# Patient Record
Sex: Male | Born: 1965 | Race: White | Hispanic: No | Smoking: Current every day smoker
Health system: Southern US, Community
[De-identification: ages and names within clinical notes are randomized; demographics above are authoritative.]

## PROBLEM LIST (undated history)

## (undated) DIAGNOSIS — R569 Unspecified convulsions: Secondary | ICD-10-CM

## (undated) HISTORY — PX: KNEE SURGERY: SHX244

## (undated) HISTORY — PX: HERNIA REPAIR: SHX51

---

## 2009-11-08 ENCOUNTER — Emergency Department (HOSPITAL_COMMUNITY): Admission: EM | Admit: 2009-11-08 | Discharge: 2009-11-08 | Payer: Self-pay | Admitting: Emergency Medicine

## 2016-02-29 ENCOUNTER — Emergency Department (HOSPITAL_COMMUNITY): Payer: Federal, State, Local not specified - PPO

## 2016-02-29 ENCOUNTER — Encounter (HOSPITAL_COMMUNITY): Payer: Self-pay | Admitting: *Deleted

## 2016-02-29 ENCOUNTER — Emergency Department (HOSPITAL_COMMUNITY)
Admission: EM | Admit: 2016-02-29 | Discharge: 2016-03-01 | Disposition: A | Discharge status: Home / Self Care | Payer: Federal, State, Local not specified - PPO | Attending: Emergency Medicine | Admitting: Emergency Medicine

## 2016-02-29 DIAGNOSIS — R569 Unspecified convulsions: Secondary | ICD-10-CM | POA: Diagnosis present

## 2016-02-29 DIAGNOSIS — R9082 White matter disease, unspecified: Secondary | ICD-10-CM | POA: Diagnosis not present

## 2016-02-29 DIAGNOSIS — F17200 Nicotine dependence, unspecified, uncomplicated: Secondary | ICD-10-CM | POA: Diagnosis not present

## 2016-02-29 LAB — CBC WITH DIFFERENTIAL/PLATELET
BASOS PCT: 1 %
Basophils Absolute: 0.1 10*3/uL (ref 0.0–0.1)
EOS ABS: 0.1 10*3/uL (ref 0.0–0.7)
Eosinophils Relative: 1 %
HCT: 40 % (ref 39.0–52.0)
Hemoglobin: 13.5 g/dL (ref 13.0–17.0)
LYMPHS ABS: 0.6 10*3/uL — AB (ref 0.7–4.0)
Lymphocytes Relative: 8 %
MCH: 32.3 pg (ref 26.0–34.0)
MCHC: 33.8 g/dL (ref 30.0–36.0)
MCV: 95.7 fL (ref 78.0–100.0)
MONO ABS: 0.7 10*3/uL (ref 0.1–1.0)
MONOS PCT: 9 %
Neutro Abs: 6.3 10*3/uL (ref 1.7–7.7)
Neutrophils Relative %: 81 %
Platelets: 123 10*3/uL — ABNORMAL LOW (ref 150–400)
RBC: 4.18 MIL/uL — ABNORMAL LOW (ref 4.22–5.81)
RDW: 14 % (ref 11.5–15.5)
WBC: 7.7 10*3/uL (ref 4.0–10.5)

## 2016-02-29 LAB — COMPREHENSIVE METABOLIC PANEL
ALBUMIN: 3.8 g/dL (ref 3.5–5.0)
ALK PHOS: 83 U/L (ref 38–126)
ALT: 87 U/L — ABNORMAL HIGH (ref 17–63)
ANION GAP: 10 (ref 5–15)
AST: 142 U/L — ABNORMAL HIGH (ref 15–41)
BUN: 13 mg/dL (ref 6–20)
CHLORIDE: 101 mmol/L (ref 101–111)
CO2: 24 mmol/L (ref 22–32)
Calcium: 9 mg/dL (ref 8.9–10.3)
Creatinine, Ser: 0.93 mg/dL (ref 0.61–1.24)
GFR calc Af Amer: >60 mL/min (ref >60)
GFR calc non Af Amer: >60 mL/min (ref >60)
GLUCOSE: 135 mg/dL — AB (ref 65–99)
POTASSIUM: 4.1 mmol/L (ref 3.5–5.1)
SODIUM: 135 mmol/L (ref 135–145)
Total Bilirubin: 0.9 mg/dL (ref 0.3–1.2)
Total Protein: 6.7 g/dL (ref 6.5–8.1)

## 2016-02-29 LAB — ETHANOL

## 2016-02-29 LAB — RAPID URINE DRUG SCREEN, HOSP PERFORMED
AMPHETAMINES: NOT DETECTED
BARBITURATES: NOT DETECTED
BENZODIAZEPINES: NOT DETECTED
Cocaine: NOT DETECTED
Opiates: NOT DETECTED
TETRAHYDROCANNABINOL: POSITIVE — AB

## 2016-02-29 LAB — I-STAT TROPONIN, ED: TROPONIN I, POC: 0 ng/mL (ref 0.00–0.08)

## 2016-02-29 LAB — CBG MONITORING, ED: Glucose-Capillary: 119 mg/dL — ABNORMAL HIGH (ref 65–99)
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
## 2016-02-29 NOTE — Discharge Instructions (Signed)
Your exam was reassuring today. Her symptoms are possibly due to a seizure. It is important for you to follow-up with neurology for definitive care. It is important that you not drive, operate machinery, bathe or cook alone until your evaluated by neurology. Return to ED for new or worsening symptoms as we discussed.  Seizure, Adult A seizure is abnormal electrical activity in the brain. Seizures usually last from 30 seconds to 2 minutes. There are various types of seizures. Before a seizure, you may have a warning sensation (aura) that a seizure is about to occur. An aura may include the following symptoms:   Fear or anxiety.  Nausea.  Feeling like the room is spinning (vertigo).  Vision changes, such as seeing flashing lights or spots. Common symptoms during a seizure include:  A change in attention or behavior (altered mental status).  Convulsions with rhythmic jerking movements.  Drooling.  Rapid eye movements.  Grunting.  Loss of bladder and bowel control.  Bitter taste in the mouth.  Tongue biting. After a seizure, you may feel confused and sleepy. You may also have an injury resulting from convulsions during the seizure. HOME CARE INSTRUCTIONS   If you are given medicines, take them exactly as prescribed by your health care provider.  Keep all follow-up appointments as directed by your health care provider.  Do not swim or drive or engage in risky activity during which a seizure could cause further injury to you or others until your health care provider says it is OK.  Get adequate rest.  Teach friends and family what to do if you have a seizure. They should:  Lay you on the ground to prevent a fall.  Put a cushion under your head.  Loosen any tight clothing around your neck.  Turn you on your side. If vomiting occurs, this helps keep your airway clear.  Stay with you until you recover.  Know whether or not you need emergency care. SEEK IMMEDIATE MEDICAL  CARE IF:  The seizure lasts longer than 5 minutes.  The seizure is severe or you do not wake up immediately after the seizure.  You have an altered mental status after the seizure.  You are having more frequent or worsening seizures. Someone should drive you to the emergency department or call local emergency services (911 in [place] MAKE SURE YOU:  Understand these instructions.  Will watch your condition.  Will get help right away if you are not doing well or get worse.   This information is not intended to replace advice given to you by your health care provider. Make sure you discuss any questions you have with your health care provider.   Document Released: 07/30/2000 Document Revised: 08/23/2014 Document Reviewed: 03/14/2013 Elsevier Interactive Patient Education [employer]2016 Elsevier Inc.
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
## 2016-02-29 NOTE — ED Notes (Signed)
Patient transported to CT 

## 2016-03-01 NOTE — ED Notes (Signed)
Pt departed in NAD.  

## 2016-03-02 ENCOUNTER — Ambulatory Visit (INDEPENDENT_AMBULATORY_CARE_PROVIDER_SITE_OTHER): Payer: Federal, State, Local not specified - PPO | Admitting: Neurology

## 2016-03-02 ENCOUNTER — Encounter: Payer: Self-pay | Admitting: Neurology

## 2016-03-02 VITALS — BP 142/92 | HR 78 | Temp 98.3°F | Ht 72.5 in | Wt 155.0 lb

## 2016-03-02 DIAGNOSIS — R569 Unspecified convulsions: Secondary | ICD-10-CM

## 2016-03-02 DIAGNOSIS — F1020 Alcohol dependence, uncomplicated: Secondary | ICD-10-CM | POA: Diagnosis not present
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
## 2016-03-11 ENCOUNTER — Ambulatory Visit (INDEPENDENT_AMBULATORY_CARE_PROVIDER_SITE_OTHER): Payer: Federal, State, Local not specified - PPO | Admitting: Neurology

## 2016-03-11 DIAGNOSIS — Z029 Encounter for administrative examinations, unspecified: Secondary | ICD-10-CM

## 2016-03-11 DIAGNOSIS — R569 Unspecified convulsions: Secondary | ICD-10-CM

## 2016-03-11 NOTE — Procedures (Signed)
ELECTROENCEPHALOGRAM REPORT  Date of Study: 03/11/2016  Patient's Name: Jack Mccarthy MRN: [id number] Date of Birth: 1965-12-21  Referring Provider: Dr. Patrcia Dolly  Clinical History: This is a 50 year old man with new onset seizure.  Medications: none  Technical Summary: A multichannel digital 1-hour sleep-deprived EEG recording measured by the international 10-20 system with electrodes applied with paste and impedances below 5000 ohms performed in our laboratory with EKG monitoring in an awake and asleep patient.  Hyperventilation and photic stimulation were performed.  The digital EEG was referentially recorded, reformatted, and digitally filtered in a variety of bipolar and referential montages for optimal display.    Description: The patient is awake and asleep during the recording.  During maximal wakefulness, there is a symmetric, medium voltage 11 Hz posterior dominant rhythm that attenuates with eye opening.  The record is symmetric.  During drowsiness and sleep, there is an increase in theta slowing of the background.  Vertex waves and symmetric sleep spindles were seen.  Hyperventilation and photic stimulation did not elicit any abnormalities.  There were no epileptiform discharges or electrographic seizures seen.    EKG lead was unremarkable.  Impression: This 1-hour awake and asleep EEG is normal.    Clinical Correlation: A normal EEG does not exclude a clinical diagnosis of epilepsy.  If further clinical questions remain, prolonged EEG may be helpful.  Clinical correlation is advised.   Patrcia Dolly, M.D.

## 2016-03-11 NOTE — Patient Instructions (Signed)
We have scheduled you at [facility] for your MRI on 03/24/2016 at 3:00 pm. Please arrive 15 minutes prior and go to radiology. If you need to reschedule for any reason please call [phone].

## 2016-03-12 ENCOUNTER — Telehealth: Payer: Self-pay | Admitting: Neurology

## 2016-03-12 NOTE — Telephone Encounter (Signed)
Tried to call patient- no answer at home number and cell phone voicemail full.

## 2016-03-12 NOTE — Telephone Encounter (Signed)
Tried to call patient again with same outcome. If he calls back will make him aware of results.

## 2016-03-12 NOTE — Telephone Encounter (Signed)
-----   Message from Van Clines, MD sent at 03/12/2016  8:33 AM EDT ----- Pls let him know EEG is normal, proceed with MRI as planned. Thanks

## 2016-03-24 ENCOUNTER — Ambulatory Visit (HOSPITAL_COMMUNITY)
Admission: RE | Admit: 2016-03-24 | Discharge: 2016-03-24 | Disposition: A | Discharge status: Home / Self Care | Payer: Federal, State, Local not specified - PPO | Source: Ambulatory Visit | Attending: Neurology | Admitting: Neurology

## 2016-03-24 DIAGNOSIS — R9082 White matter disease, unspecified: Secondary | ICD-10-CM | POA: Insufficient documentation

## 2016-03-24 DIAGNOSIS — R569 Unspecified convulsions: Secondary | ICD-10-CM | POA: Insufficient documentation

## 2016-03-24 MED ORDER — GADOBENATE DIMEGLUMINE 529 MG/ML IV SOLN
15.0000 mL | Freq: Once | INTRAVENOUS | Status: AC | PRN
  Administered 2016-03-24: 14 mL via INTRAVENOUS

## 2016-03-26 NOTE — Progress Notes (Signed)
Called patient on mobile mailbox full. Called home and no answer, no voicemail.

## 2016-04-30 ENCOUNTER — Encounter: Payer: Self-pay | Admitting: Neurology

## 2016-04-30 ENCOUNTER — Ambulatory Visit (INDEPENDENT_AMBULATORY_CARE_PROVIDER_SITE_OTHER): Payer: Federal, State, Local not specified - PPO | Admitting: Neurology

## 2016-04-30 VITALS — BP 122/70 | HR 82 | Temp 98.2°F | Ht 72.5 in | Wt 157.2 lb

## 2016-04-30 DIAGNOSIS — Z029 Encounter for administrative examinations, unspecified: Secondary | ICD-10-CM

## 2016-04-30 DIAGNOSIS — F1020 Alcohol dependence, uncomplicated: Secondary | ICD-10-CM

## 2016-04-30 DIAGNOSIS — R569 Unspecified convulsions: Secondary | ICD-10-CM
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
## 2016-09-08 ENCOUNTER — Ambulatory Visit: Payer: Federal, State, Local not specified - PPO | Admitting: Neurology

## 2017-01-25 ENCOUNTER — Telehealth: Payer: Self-pay | Admitting: Neurology

## 2017-01-25 NOTE — Telephone Encounter (Signed)
Returned pt call.  Let him know that I haven't received anything from him.  Gave him the nurse fax number.  Pt states he is faxing forms now.

## 2017-01-25 NOTE — Telephone Encounter (Signed)
Caller: Cleto   Urgent? No   Reason for the call: Checking if a paper that he faxed yesterday was received? [facility]. Please call if received. Thanks

## 2017-02-01 ENCOUNTER — Encounter: Payer: Self-pay | Admitting: Neurology

## 2017-02-01 ENCOUNTER — Telehealth: Payer: Self-pay | Admitting: Neurology

## 2017-02-01 NOTE — Telephone Encounter (Signed)
Spoke to patient, he has been doing well, no further similar episodes since July 2017. Forms filled out, pls fax [facility], thanks

## 2017-02-01 NOTE — Telephone Encounter (Signed)
Patient is returning your call. He states that he will be at work until 4 and you can call him there at [phone]  He no longer has a home number he stated

## 2018-07-03 ENCOUNTER — Telehealth: Payer: Self-pay

## 2018-07-03 NOTE — Telephone Encounter (Signed)
LMOM letting pt know that I have filled out DMV paperwork and [facility] [place].  I also advised that I have made a copy of paperwork for pt and placed at front desk for him to pick up at his convenience.

## 2020-07-13 ENCOUNTER — Emergency Department (HOSPITAL_COMMUNITY): Payer: Federal, State, Local not specified - PPO

## 2020-07-13 ENCOUNTER — Emergency Department (HOSPITAL_COMMUNITY)
Admission: EM | Admit: 2020-07-13 | Discharge: 2020-07-14 | Disposition: A | Discharge status: Home / Self Care | Payer: Federal, State, Local not specified - PPO | Attending: Emergency Medicine | Admitting: Emergency Medicine

## 2020-07-13 ENCOUNTER — Encounter (HOSPITAL_COMMUNITY): Payer: Self-pay | Admitting: Emergency Medicine

## 2020-07-13 ENCOUNTER — Other Ambulatory Visit: Payer: Self-pay

## 2020-07-13 DIAGNOSIS — F17210 Nicotine dependence, cigarettes, uncomplicated: Secondary | ICD-10-CM | POA: Insufficient documentation

## 2020-07-13 DIAGNOSIS — Y990 Civilian activity done for income or pay: Secondary | ICD-10-CM | POA: Diagnosis not present

## 2020-07-13 DIAGNOSIS — R42 Dizziness and giddiness: Secondary | ICD-10-CM | POA: Insufficient documentation

## 2020-07-13 DIAGNOSIS — R569 Unspecified convulsions: Secondary | ICD-10-CM

## 2020-07-13 DIAGNOSIS — G40509 Epileptic seizures related to external causes, not intractable, without status epilepticus: Secondary | ICD-10-CM | POA: Diagnosis present

## 2020-07-13 DIAGNOSIS — Y33XXXA Other specified events, undetermined intent, initial encounter: Secondary | ICD-10-CM | POA: Diagnosis not present

## 2020-07-13 DIAGNOSIS — F10239 Alcohol dependence with withdrawal, unspecified: Secondary | ICD-10-CM | POA: Diagnosis not present

## 2020-07-13 DIAGNOSIS — F10939 Alcohol use, unspecified with withdrawal, unspecified: Secondary | ICD-10-CM

## 2020-07-13 HISTORY — DX: Unspecified convulsions: R56.9

## 2020-07-13 LAB — CBC WITH DIFFERENTIAL/PLATELET
Abs Immature Granulocytes: 0.05 10*3/uL (ref 0.00–0.07)
Basophils Absolute: 0.1 10*3/uL (ref 0.0–0.1)
Basophils Relative: 1 %
Eosinophils Absolute: 0 10*3/uL (ref 0.0–0.5)
Eosinophils Relative: 0 %
HCT: 35.4 % — ABNORMAL LOW (ref 39.0–52.0)
Hemoglobin: 12.4 g/dL — ABNORMAL LOW (ref 13.0–17.0)
Immature Granulocytes: 1 %
Lymphocytes Relative: 10 %
Lymphs Abs: 0.8 10*3/uL (ref 0.7–4.0)
MCH: 34.6 pg — ABNORMAL HIGH (ref 26.0–34.0)
MCHC: 35 g/dL (ref 30.0–36.0)
MCV: 98.9 fL (ref 80.0–100.0)
Monocytes Absolute: 1.3 10*3/uL — ABNORMAL HIGH (ref 0.1–1.0)
Monocytes Relative: 16 %
Neutro Abs: 5.8 10*3/uL (ref 1.7–7.7)
Neutrophils Relative %: 72 %
Platelets: 144 10*3/uL — ABNORMAL LOW (ref 150–400)
RBC: 3.58 MIL/uL — ABNORMAL LOW (ref 4.22–5.81)
RDW: 14 % (ref 11.5–15.5)
WBC: 8 10*3/uL (ref 4.0–10.5)
nRBC: 0 % (ref 0.0–0.2)

## 2020-07-13 LAB — URINALYSIS, ROUTINE W REFLEX MICROSCOPIC
Bacteria, UA: NONE SEEN
Bilirubin Urine: NEGATIVE
Glucose, UA: NEGATIVE mg/dL
Hgb urine dipstick: NEGATIVE
Ketones, ur: 5 mg/dL — AB
Leukocytes,Ua: NEGATIVE
Nitrite: NEGATIVE
Protein, ur: 100 mg/dL — AB
Specific Gravity, Urine: 1.017 (ref 1.005–1.030)
pH: 8 (ref 5.0–8.0)

## 2020-07-13 LAB — TROPONIN I (HIGH SENSITIVITY)
Troponin I (High Sensitivity): 4 ng/L (ref <18)
Troponin I (High Sensitivity): 5 ng/L (ref <18)

## 2020-07-13 LAB — PROTIME-INR
INR: 0.9 (ref 0.8–1.2)
Prothrombin Time: 12 seconds (ref 11.4–15.2)

## 2020-07-13 LAB — COMPREHENSIVE METABOLIC PANEL
ALT: 47 U/L — ABNORMAL HIGH (ref 0–44)
AST: 66 U/L — ABNORMAL HIGH (ref 15–41)
Albumin: 3.8 g/dL (ref 3.5–5.0)
Alkaline Phosphatase: 73 U/L (ref 38–126)
Anion gap: 13 (ref 5–15)
BUN: 8 mg/dL (ref 6–20)
CO2: 23 mmol/L (ref 22–32)
Calcium: 8.4 mg/dL — ABNORMAL LOW (ref 8.9–10.3)
Chloride: 99 mmol/L (ref 98–111)
Creatinine, Ser: 0.66 mg/dL (ref 0.61–1.24)
GFR, Estimated: >60 mL/min (ref >60)
Glucose, Bld: 94 mg/dL (ref 70–99)
Potassium: 3.2 mmol/L — ABNORMAL LOW (ref 3.5–5.1)
Sodium: 135 mmol/L (ref 135–145)
Total Bilirubin: 1 mg/dL (ref 0.3–1.2)
Total Protein: 6.9 g/dL (ref 6.5–8.1)

## 2020-07-13 LAB — RAPID URINE DRUG SCREEN, HOSP PERFORMED
Amphetamines: NOT DETECTED
Barbiturates: NOT DETECTED
Benzodiazepines: NOT DETECTED
Cocaine: NOT DETECTED
Opiates: NOT DETECTED
Tetrahydrocannabinol: POSITIVE — AB

## 2020-07-13 LAB — MAGNESIUM: Magnesium: 1.5 mg/dL — ABNORMAL LOW (ref 1.7–2.4)

## 2020-07-13 LAB — ETHANOL: Alcohol, Ethyl (B): <10 mg/dL (ref <10)

## 2020-07-13 LAB — TSH: TSH: 3.423 u[IU]/mL (ref 0.350–4.500)

## 2020-07-13 LAB — PHOSPHORUS: Phosphorus: 2.5 mg/dL (ref 2.5–4.6)

## 2020-07-13 MED ORDER — LORAZEPAM 2 MG/ML IJ SOLN
1.0000 mg | Freq: Once | INTRAMUSCULAR | Status: AC
  Administered 2020-07-13: 1 mg via INTRAVENOUS
  Filled 2020-07-13: qty 1

## 2020-07-13 MED ORDER — CHLORDIAZEPOXIDE HCL 25 MG PO CAPS: ORAL_CAPSULE | ORAL | 0 refills | Status: AC

## 2020-07-13 MED ORDER — THIAMINE HCL 100 MG/ML IJ SOLN
Freq: Once | INTRAVENOUS | Status: AC
  Filled 2020-07-13: qty 1000

## 2020-07-13 MED ORDER — LORAZEPAM 2 MG/ML IJ SOLN
2.0000 mg | Freq: Once | INTRAMUSCULAR | Status: AC
  Administered 2020-07-13: 2 mg via INTRAVENOUS
  Filled 2020-07-13: qty 1

## 2020-07-13 NOTE — Discharge Instructions (Addendum)
1.  At this time, I have high suspicion that your seizure was due to alcohol withdrawal.  Marijuana may also make you more susceptible to having a seizure.  Avoid all marijuana at this time.  If you are going to stop drinking, you will experience withdrawal symptoms.  You have been given a prescription for Librium.  This is a medication to avoid alcohol withdrawal.  Take as prescribed.  Alcohol withdrawal is a life-threatening condition. 2.  Return to the emergency department immediately if you have any episodes of confusion, hallucinations, a repeat seizure or any other concerning symptoms. 3.  Make an appointment to see your doctor as soon as possible for recheck.  A resource guide has been included for treatment programs.
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
## 2020-07-13 NOTE — ED Provider Notes (Signed)
[facility]-EMERGENCY DEPT Provider Note   CSN: [id number] Arrival date & time: 07/13/20  1644     History Chief Complaint  Patient presents with  . Seizures    Jack Mccarthy is a 54 y.o. male.  HPI Patient was off work due to having Covid.  Today he went back for first day.  He was sitting at a desk and he reports he suddenly felt slightly dizzy and that is lasting he remembers.  Coworker eased the patient to the floor and reported seizure activity.  EMS reports the patient was incontinent of urine.  Duration of seizure unknown as no direct witnesses available now.  Per EMS report, patient was somewhat postictal at the time of their arrival.  Patient's mental status is now clear.  He denies headache, visual change, palpitation, chest pain, shortness of breath.  He reports he went to work at about 7:30 AM.  He felt fine when he went to work.  Patient does have history of one other seizure several years ago.  He reports it was similar.  EMR review indicates at that time there was suspicion of possible alcohol withdrawal seizure.  No other etiology was identified.  Patient reports he is drinking a pint of liquor a day.    Past Medical History:  Diagnosis Date  . Seizures [facility])     Patient Active Problem List   Diagnosis Date Noted  . New onset seizure (HCC) 03/02/2016  . Uncomplicated alcohol dependence (HCC) 03/02/2016    Past Surgical History:  Procedure Laterality Date  . HERNIA REPAIR    . KNEE SURGERY         No family history on file.  Social History   Tobacco Use  . Smoking status: Current Every Day Smoker  Substance Use Topics  . Alcohol use: Yes    Alcohol/week: 0.0 standard drinks    Comment: 6 beers and couple shots each night  . Drug use: No    Home Medications Prior to Admission medications   Medication Sig Start Date End Date Taking? Authorizing Provider  chlordiazePOXIDE (LIBRIUM) 25 MG capsule 50mg  PO TID x 1D, then 25-50mg   PO BID X 1D, then 25-50mg  PO QD X 1D 07/13/20   07/15/20, MD    Allergies    Percocet [oxycodone-acetaminophen]  Review of Systems   Review of Systems 10 Systems reviewed and negative except as per HPI Physical Exam Updated Vital Signs BP (!) 123/98 (BP Location: Right Arm)   Pulse 89   Temp 98.4 F (36.9 C) (Oral)   Resp 16   SpO2 98%   Physical Exam Constitutional:      Comments: Patient is alert.  Nontoxic.  No respiratory distress.  Mental status clear.  HENT:     Head: Normocephalic and atraumatic.     Nose: Nose normal.     Mouth/Throat:     Pharynx: Oropharynx is clear.  Eyes:     Extraocular Movements: Extraocular movements intact.     Pupils: Pupils are equal, round, and reactive to light.  Cardiovascular:     Rate and Rhythm: Normal rate and regular rhythm.  Pulmonary:     Effort: Pulmonary effort is normal.     Breath sounds: Normal breath sounds.  Abdominal:     General: There is no distension.     Palpations: Abdomen is soft.     Tenderness: There is no abdominal tenderness. There is no guarding.  Musculoskeletal:  General: No swelling or tenderness. Normal range of motion.     Cervical back: Neck supple.     Right lower leg: No edema.     Left lower leg: No edema.  Skin:    General: Skin is warm and dry.  Neurological:     Comments: Patient is alert with GCS of 15.  No confusion.  Speech is clear.  No focal motor deficits.  Patient does have fine tremor of the hands.  He can perform finger-nose exam and heel shin exam.  Psychiatric:        Mood and Affect: Mood normal.     ED Results / Procedures / Treatments   Labs (all labs ordered are listed, but only abnormal results are displayed) Labs Reviewed  COMPREHENSIVE METABOLIC PANEL - Abnormal; Notable for the following components:      Result Value   Potassium 3.2 (*)    Calcium 8.4 (*)    AST 66 (*)    ALT 47 (*)    All other components within normal limits  CBC WITH  DIFFERENTIAL/PLATELET - Abnormal; Notable for the following components:   RBC 3.58 (*)    Hemoglobin 12.4 (*)    HCT 35.4 (*)    MCH 34.6 (*)    Platelets 144 (*)    Monocytes Absolute 1.3 (*)    All other components within normal limits  URINALYSIS, ROUTINE W REFLEX MICROSCOPIC - Abnormal; Notable for the following components:   Ketones, ur 5 (*)    Protein, ur 100 (*)    All other components within normal limits  RAPID URINE DRUG SCREEN, HOSP PERFORMED - Abnormal; Notable for the following components:   Tetrahydrocannabinol POSITIVE (*)    All other components within normal limits  MAGNESIUM - Abnormal; Notable for the following components:   Magnesium 1.5 (*)    All other components within normal limits  ETHANOL  PROTIME-INR  PHOSPHORUS  TSH  AMMONIA  TROPONIN I (HIGH SENSITIVITY)  TROPONIN I (HIGH SENSITIVITY)    EKG EKG Interpretation  Date/Time:  Sunday July 13 2020 20:35:08 EST Ventricular Rate:  81 PR Interval:    QRS Duration: 107 QT Interval:  402 QTC Calculation: 467 R Axis:   80 Text Interpretation: Sinus rhythm normal, no change from old Confirmed by Arby Barrette [id number]) on 07/13/2020 8:58:05 PM   Radiology DG Chest 2 View  Result Date: 07/13/2020 CLINICAL DATA:  Recent syncopal episode EXAM: CHEST - 2 VIEW COMPARISON:  None. FINDINGS: Cardiac shadow is within normal limits. The lungs are well aerated bilaterally. No focal infiltrate or effusion is seen. Old rib fractures are noted on the right. No other focal abnormality is noted. IMPRESSION: No acute abnormality noted. Electronically Signed   By: Alcide Clever M.D.   On: 07/13/2020 20:26   CT Head Wo Contrast  Result Date: 07/13/2020 CLINICAL DATA:  Seizures. EXAM: CT HEAD WITHOUT CONTRAST TECHNIQUE: Contiguous axial images were obtained from the base of the skull through the vertex without intravenous contrast. COMPARISON:  February 29, 2016 FINDINGS: Brain: There is mild cerebral atrophy with  widening of the extra-axial spaces and ventricular dilatation. There are areas of decreased attenuation within the white matter tracts of the supratentorial brain, consistent with microvascular disease changes. Vascular: No hyperdense vessel or unexpected calcification. Skull: Normal. Negative for fracture or focal lesion. Sinuses/Orbits: No acute finding. Other: None. IMPRESSION: 1. No acute intracranial abnormality. Electronically Signed   By: Aram Candela M.D.   On: 07/13/2020 20:29  Procedures Procedures (including critical care time)  Medications Ordered in ED Medications  LORazepam (ATIVAN) injection 2 mg (2 mg Intravenous Given 07/13/20 1957)  sodium chloride 0.9 % 1,000 mL with thiamine 100 mg, multivitamins adult 10 mL infusion ( Intravenous New Bag/Given 07/13/20 2036)  LORazepam (ATIVAN) injection 1 mg (1 mg Intravenous Given 07/13/20 2317)    ED Course  I have reviewed the triage vital signs and the nursing notes.  Pertinent labs & imaging results that were available during my care of the patient were reviewed by me and considered in my medical decision making (see chart for details).    MDM Rules/Calculators/A&P                          Patient presents with reported seizure.  No injury associated.  CT head negative.  No other acute findings on diagnostic evaluation.  Patient had one prior seizure about 2 years ago without specific etiology but suspected alcohol withdrawal.  Patient does drink a pint of liquor a day.  He has been home from work and just went back for first day today.  Patient had not had any alcohol day and blood alcohol is negative.  Patient does have fine tremor.  At this time, I have high suspicion for alcohol withdrawal seizure.  Patient was given Ativan 2 mg IV.  I reviewed the risks of alcohol withdrawal and suggested hospitalization over home management.  However, patient advises he wants to be at home.  He is here with his wife who is been a Teacher, music and familiar with the risks of alcohol withdrawal. Final Clinical Impression(s) / ED Diagnoses Final diagnoses:  Seizure (HCC)  Alcohol withdrawal syndrome with complication (HCC)    Rx / DC Orders ED Discharge Orders         Ordered    chlordiazePOXIDE (LIBRIUM) 25 MG capsule        07/13/20 2352           Arby Barrette, MD 07/14/20 0006

## 2021-03-03 IMAGING — CR DG CHEST 2V
3 series · 3 of 3 positions shown · non-contrast
Comparison: None.

CLINICAL DATA: Recent syncopal episode

EXAM:
CHEST - 2 VIEW

[w chest lat]
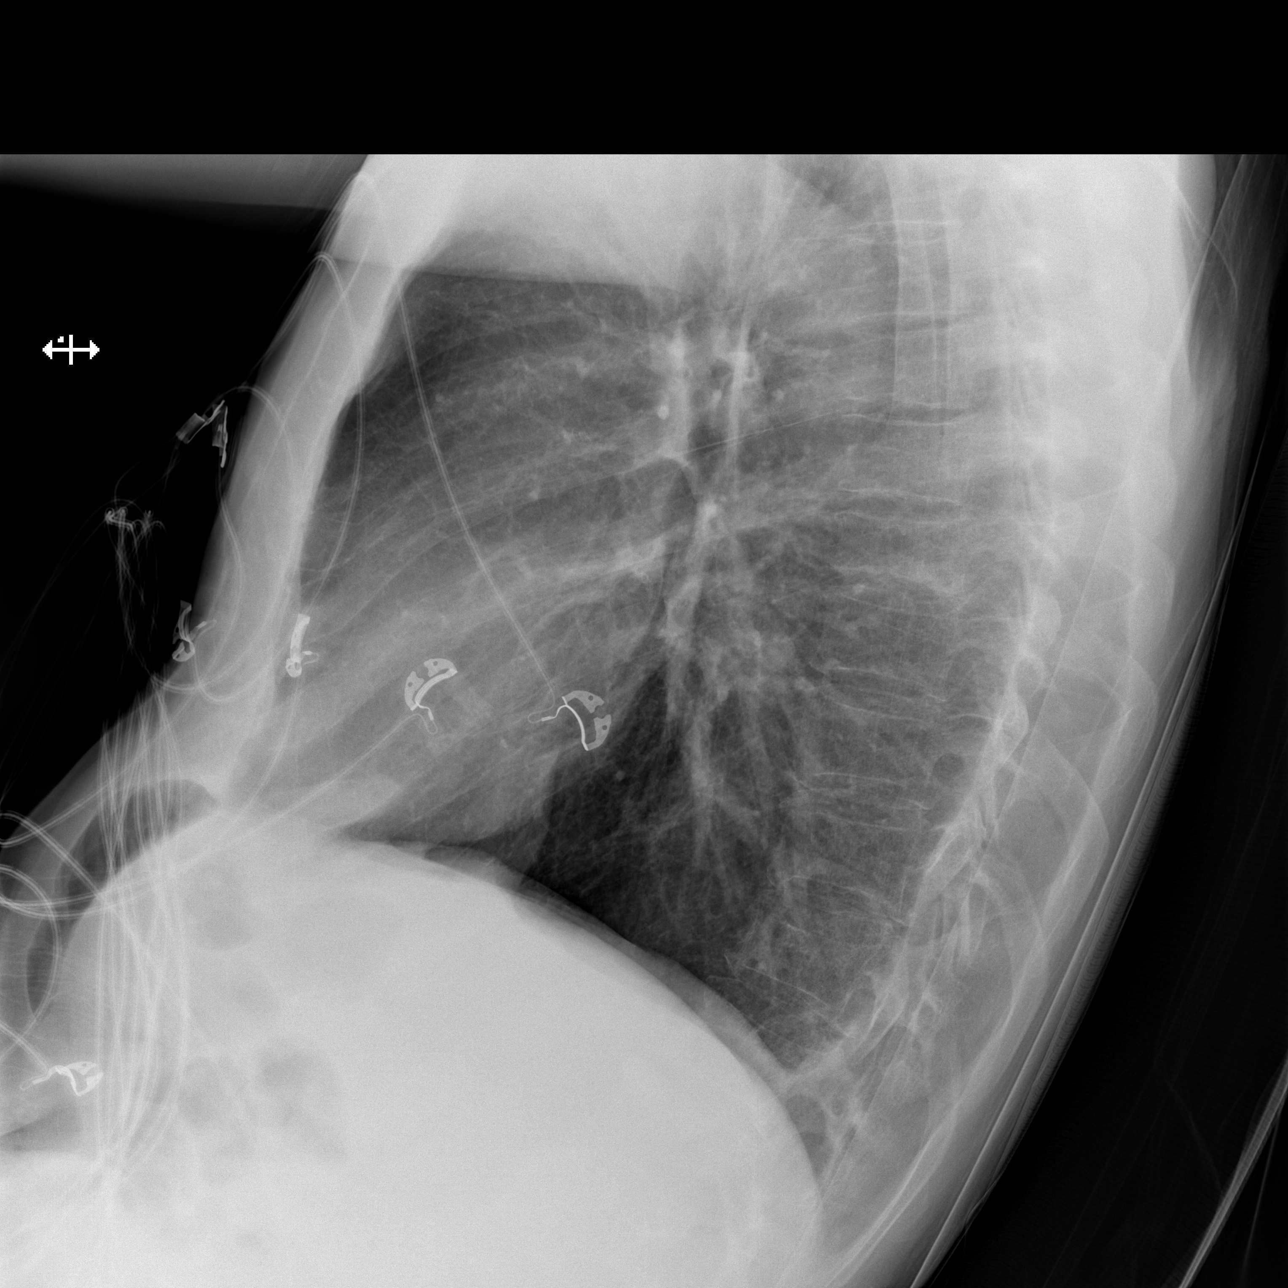

[x chest ap (1 of 2)]
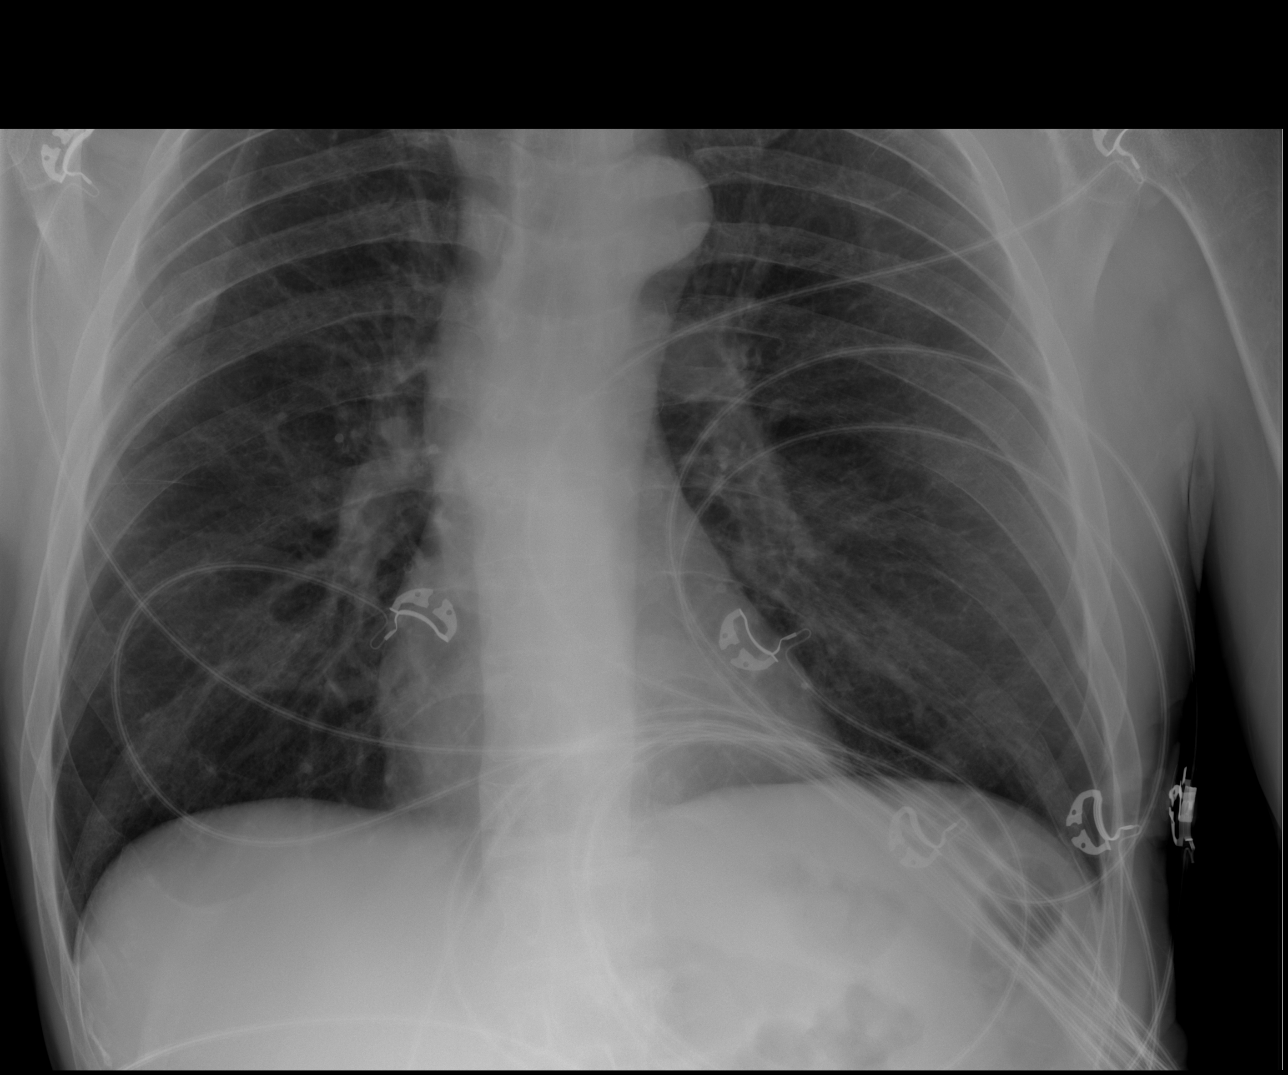

[x chest ap (2 of 2)]
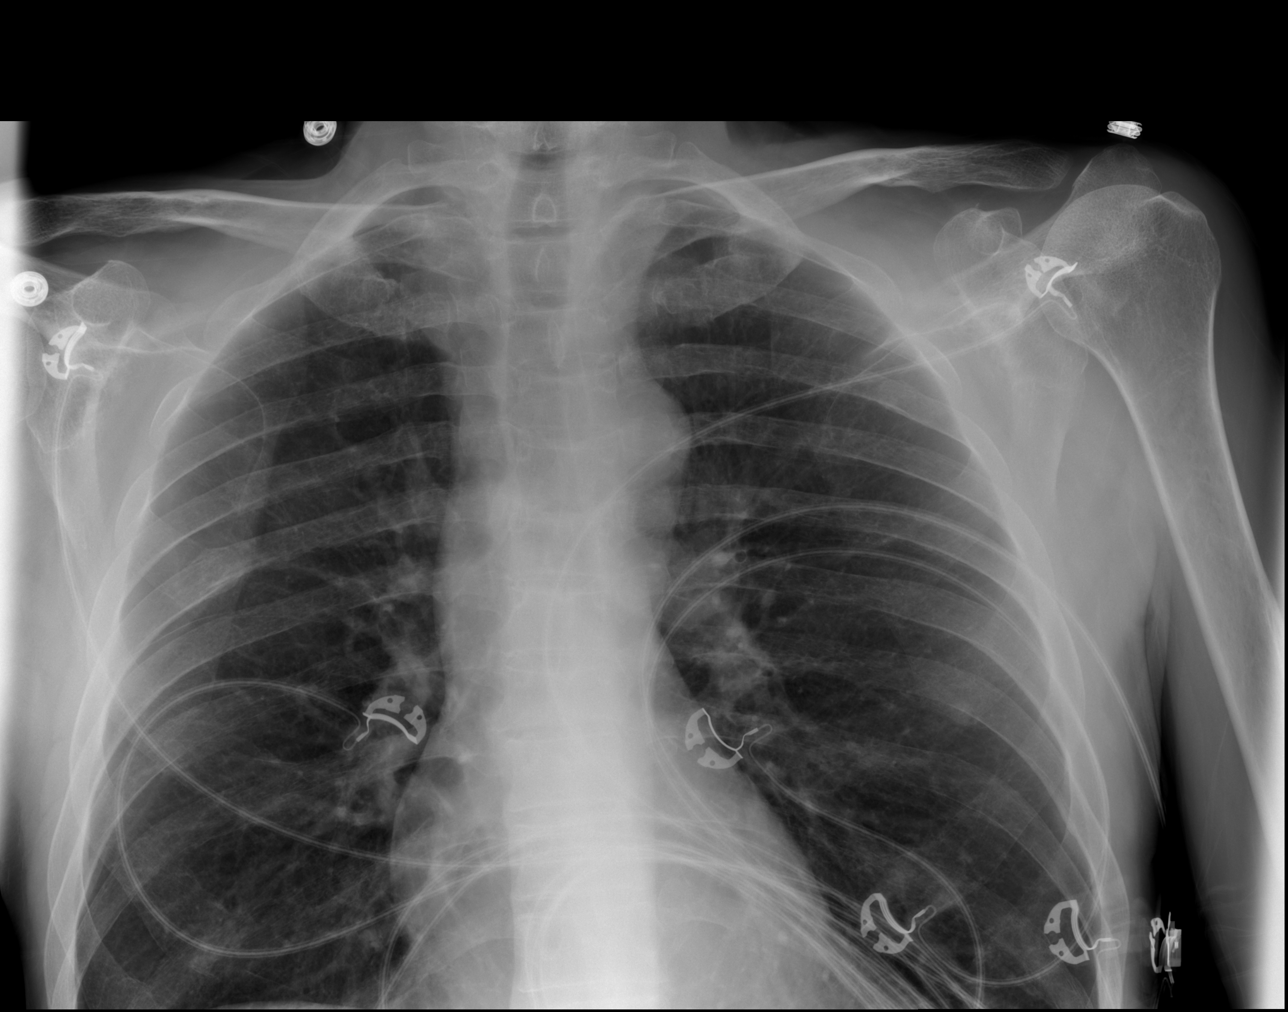

[3 of 3 positions shown; findings below may reference images not displayed]

FINDINGS: Cardiac shadow is within normal limits. The lungs are well aerated
bilaterally. No focal infiltrate or effusion is seen. Old rib
fractures are noted on the right. No other focal abnormality is
noted.
IMPRESSION: No acute abnormality noted.

## 2021-03-03 IMAGING — CT CT HEAD W/O CM
2 series · 15 of 37 positions shown, 18 images · non-contrast
Comparison: February 29, 2016

CLINICAL DATA: Seizures.

EXAM:
CT HEAD WITHOUT CONTRAST
TECHNIQUE: Contiguous axial images were obtained from the base of the skull
through the vertex without intravenous contrast.

[Series 3: head bone · axial · 0.47mm/px · z∈[-142,-7]mm · 12 of 33 slices shown, 15 images]
[im 3/33  brain]
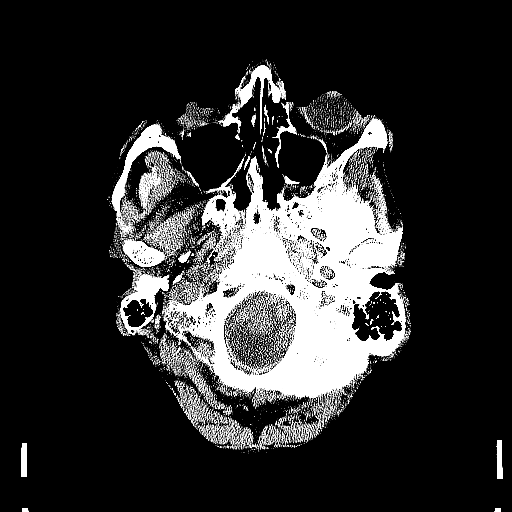
[im 3/33  bone]
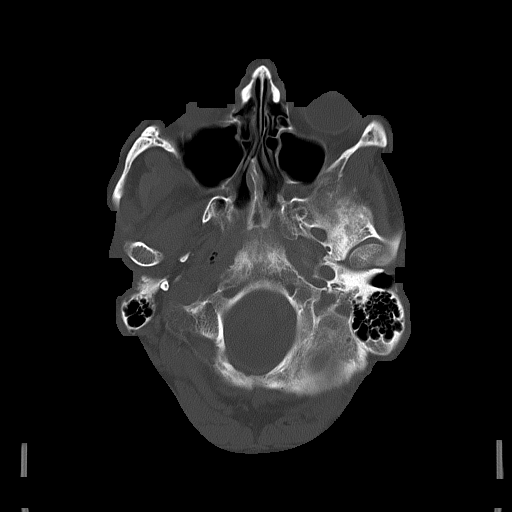
[im 5/33  brain]
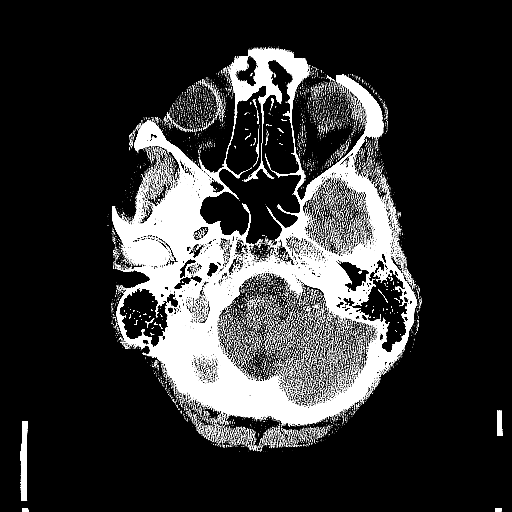
[im 7/33  brain]
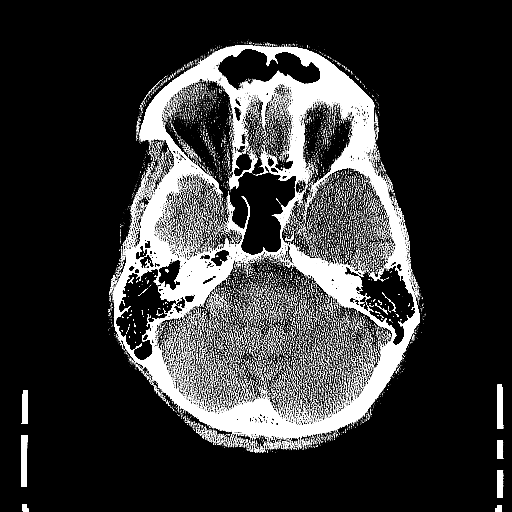
[im 10/33  brain]
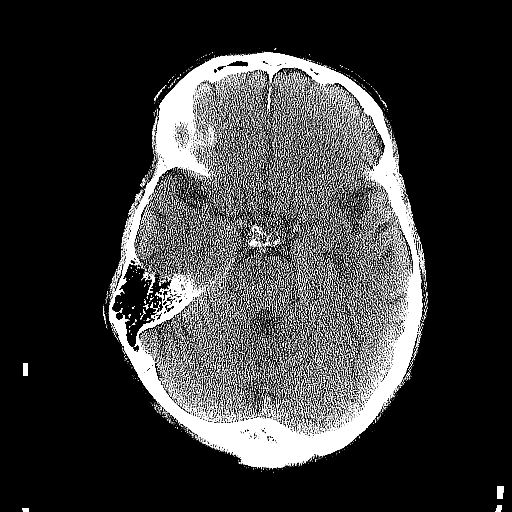
[im 13/33  brain]
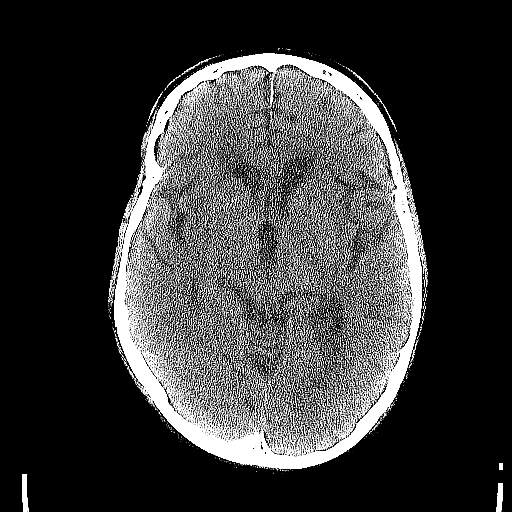
[im 13/33  bone]
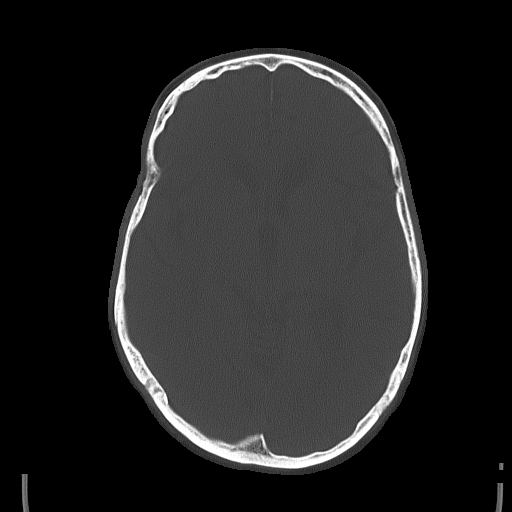
[im 15/33  brain]
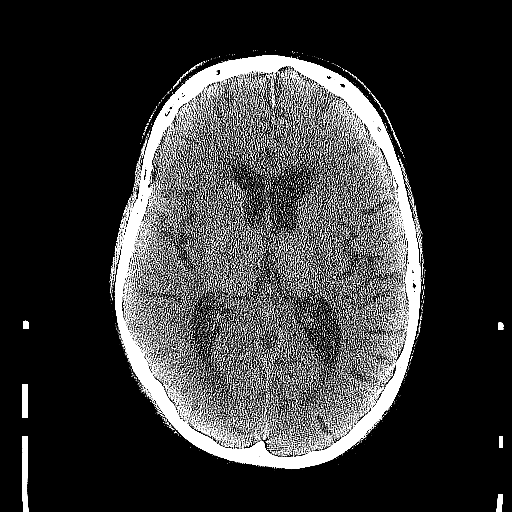
[im 18/33  brain]
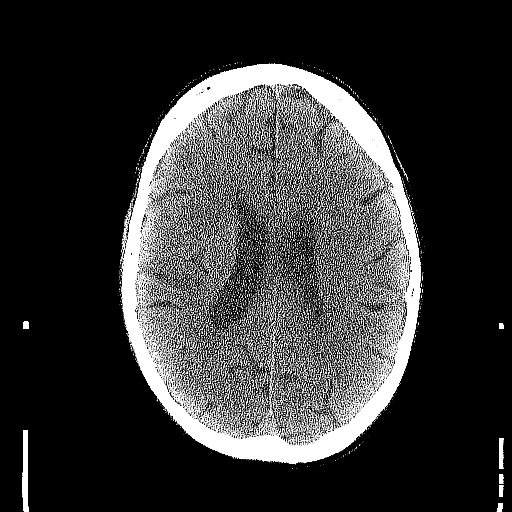
[im 20/33  brain]
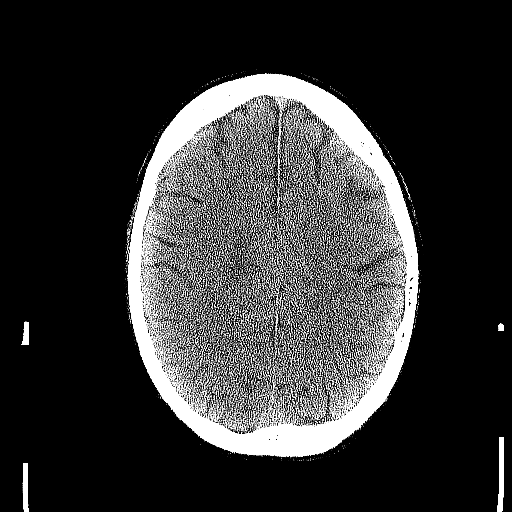
[im 23/33  brain]
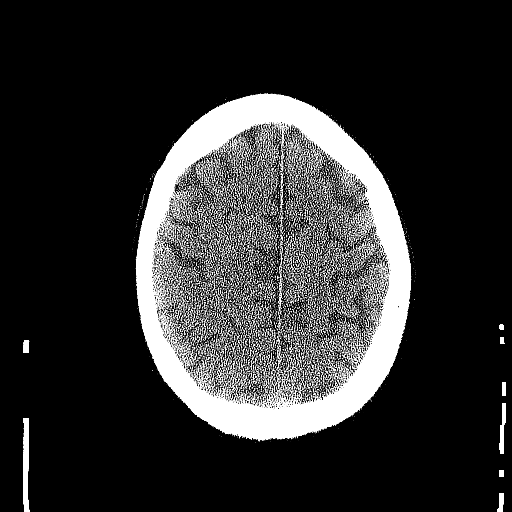
[im 23/33  bone]
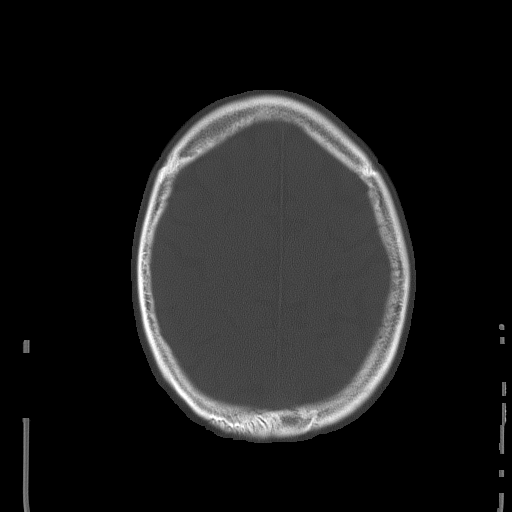
[im 26/33  brain]
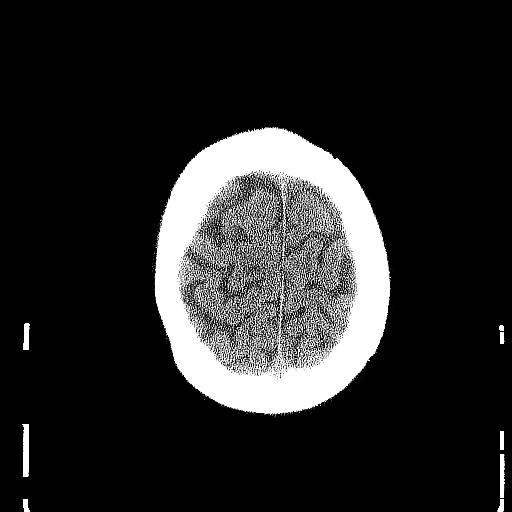
[im 28/33  brain]
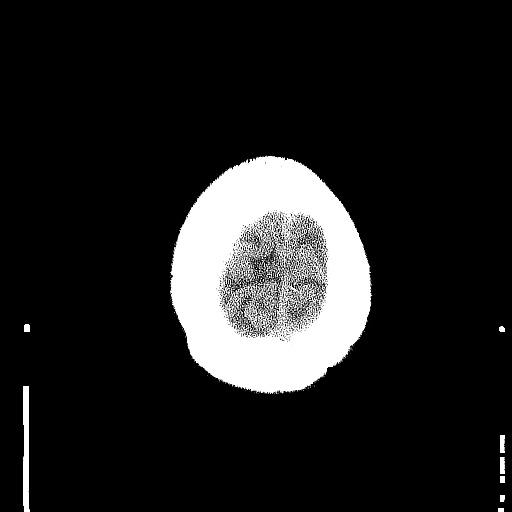
[im 30/33  brain]
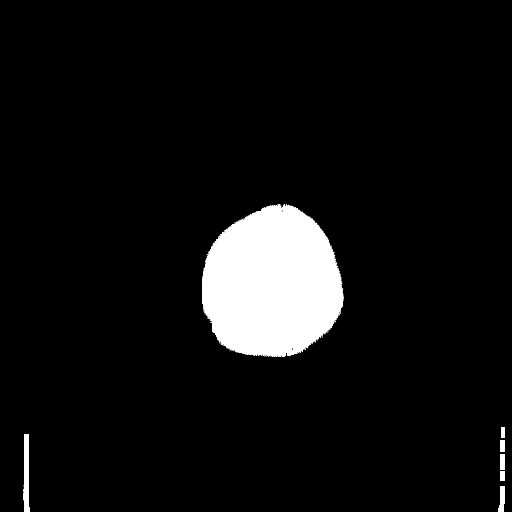

[Series 6: sagittal soft tissue · sagittal · 0.32mm/px · 3 of 60 slices shown]
[im 20/60  brain]
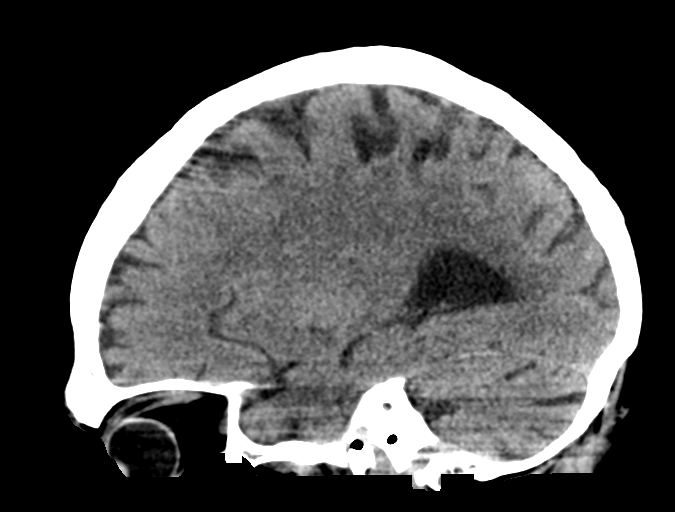
[im 30/60  brain]
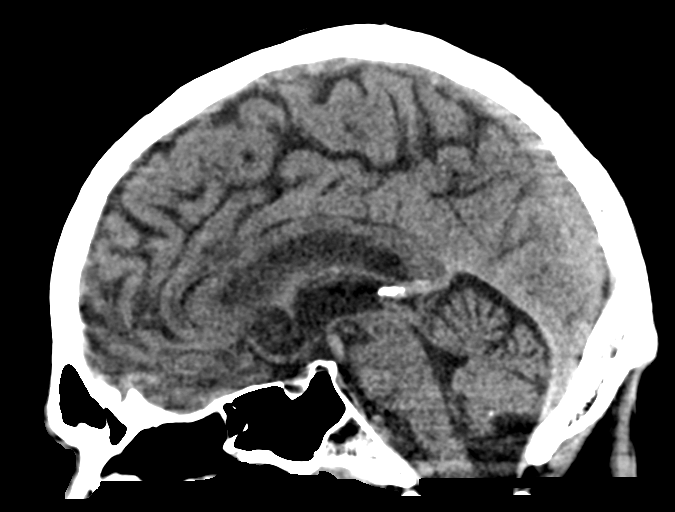
[im 40/60  brain]
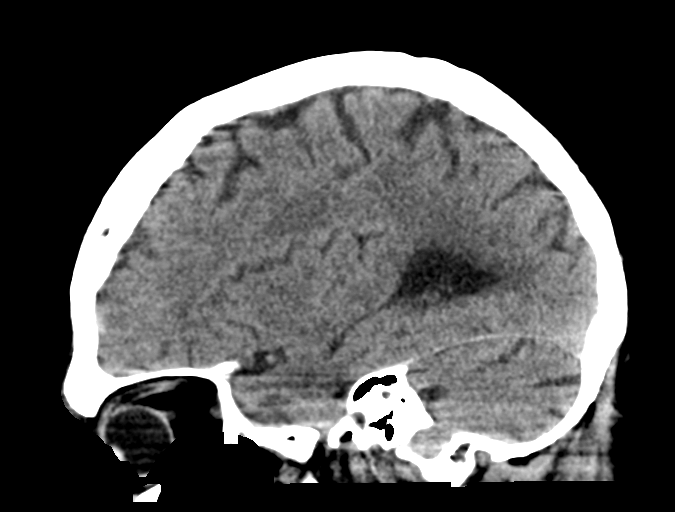

[15 of 37 positions shown; findings below may reference images not displayed]

FINDINGS: Brain: There is mild cerebral atrophy with widening of the
extra-axial spaces and ventricular dilatation.
There are areas of decreased attenuation within the white matter
tracts of the supratentorial brain, consistent with microvascular
disease changes.

Vascular: No hyperdense vessel or unexpected calcification.

Skull: Normal. Negative for fracture or focal lesion.

Sinuses/Orbits: No acute finding.

Other: None.
IMPRESSION: 1. No acute intracranial abnormality.

## 2023-02-17 ENCOUNTER — Encounter (HOSPITAL_COMMUNITY): Payer: Self-pay
